# Patient Record
Sex: Female | Born: 2000
Health system: Southern US, Community
[De-identification: ages and names within clinical notes are randomized; demographics above are authoritative.]

## PROBLEM LIST (undated history)

## (undated) HISTORY — PX: NO PAST SURGERIES: SHX2092

---
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
## 2021-02-01 DIAGNOSIS — Z20822 Contact with and (suspected) exposure to covid-19: Secondary | ICD-10-CM | POA: Diagnosis not present

## 2021-04-03 DIAGNOSIS — Z23 Encounter for immunization: Secondary | ICD-10-CM | POA: Diagnosis not present

## 2021-04-03 DIAGNOSIS — Z0000 Encounter for general adult medical examination without abnormal findings: Secondary | ICD-10-CM | POA: Diagnosis not present

## 2021-04-03 DIAGNOSIS — Z713 Dietary counseling and surveillance: Secondary | ICD-10-CM | POA: Diagnosis not present

## 2021-04-03 DIAGNOSIS — Z113 Encounter for screening for infections with a predominantly sexual mode of transmission: Secondary | ICD-10-CM | POA: Diagnosis not present

## 2021-04-03 DIAGNOSIS — M79601 Pain in right arm: Secondary | ICD-10-CM | POA: Diagnosis not present

## 2021-07-17 DIAGNOSIS — H5213 Myopia, bilateral: Secondary | ICD-10-CM | POA: Diagnosis not present

## 2021-07-21 IMAGING — CR DG FOOT COMPLETE 3+V*L*
3 series · 3 of 3 positions shown · non-contrast
Comparison: None.

CLINICAL DATA: 19-year-old female with left foot pain and swelling.

EXAM:
LEFT FOOT - COMPLETE 3+ VIEW

[foot ap]
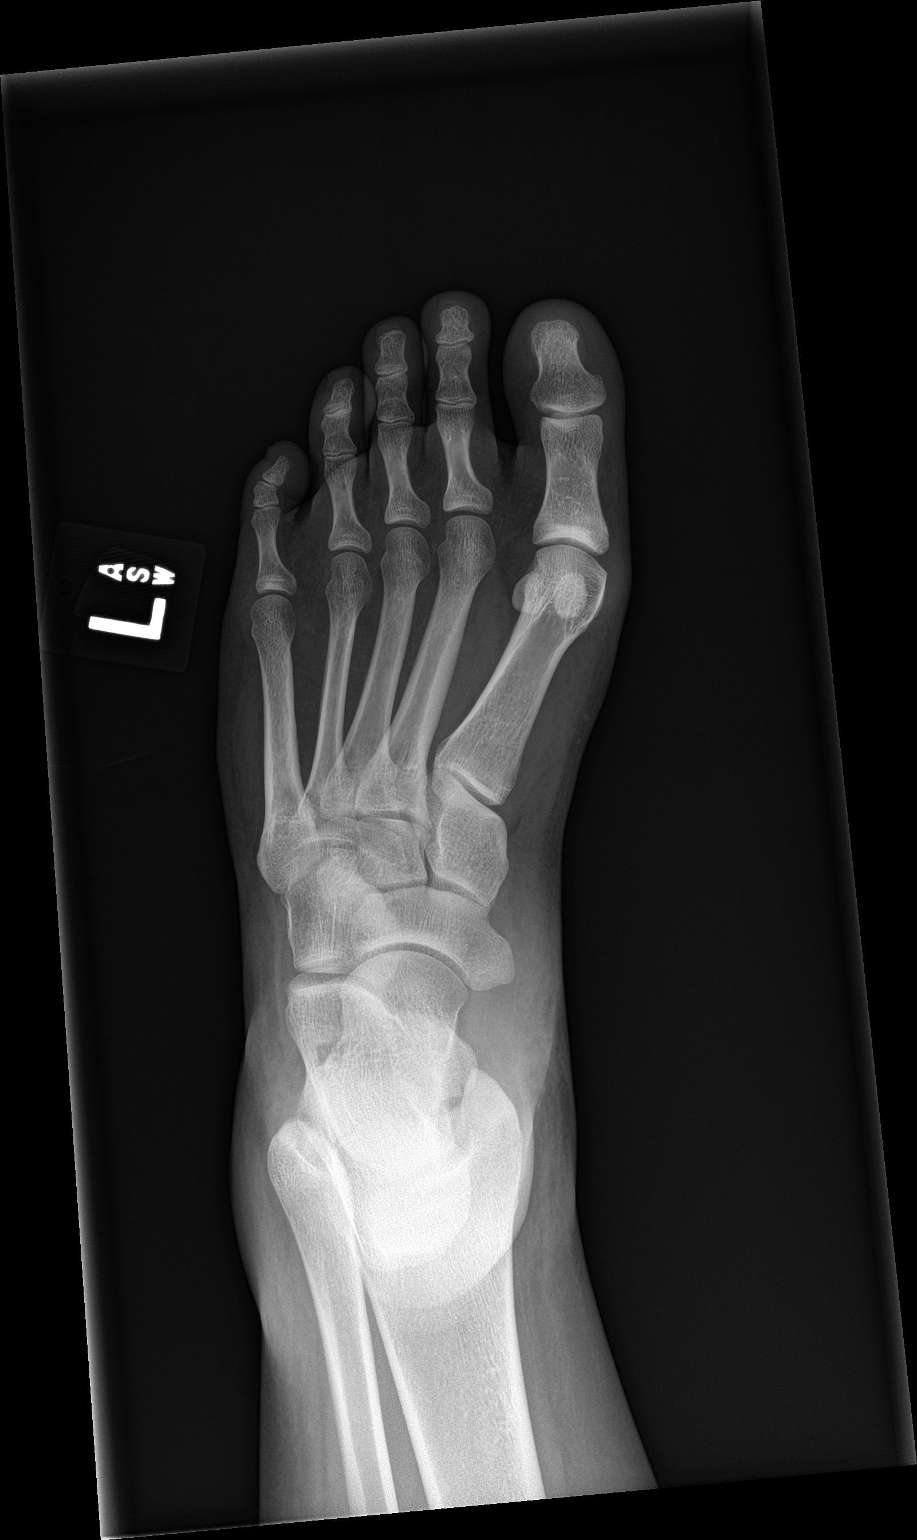

[foot obl]
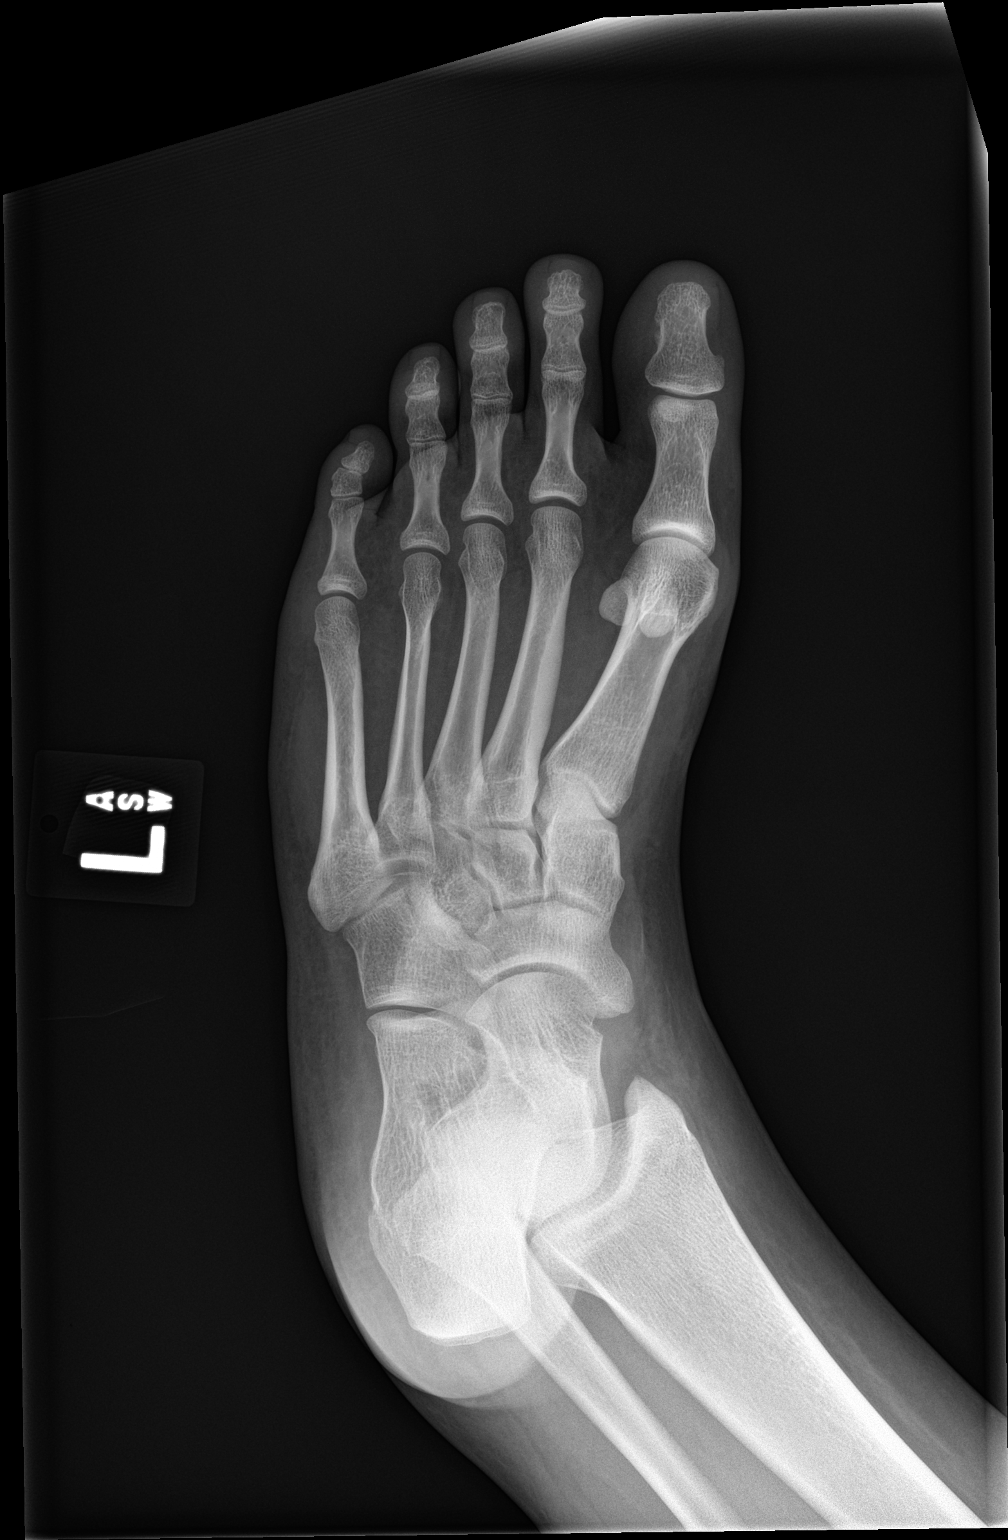

[foot lat]
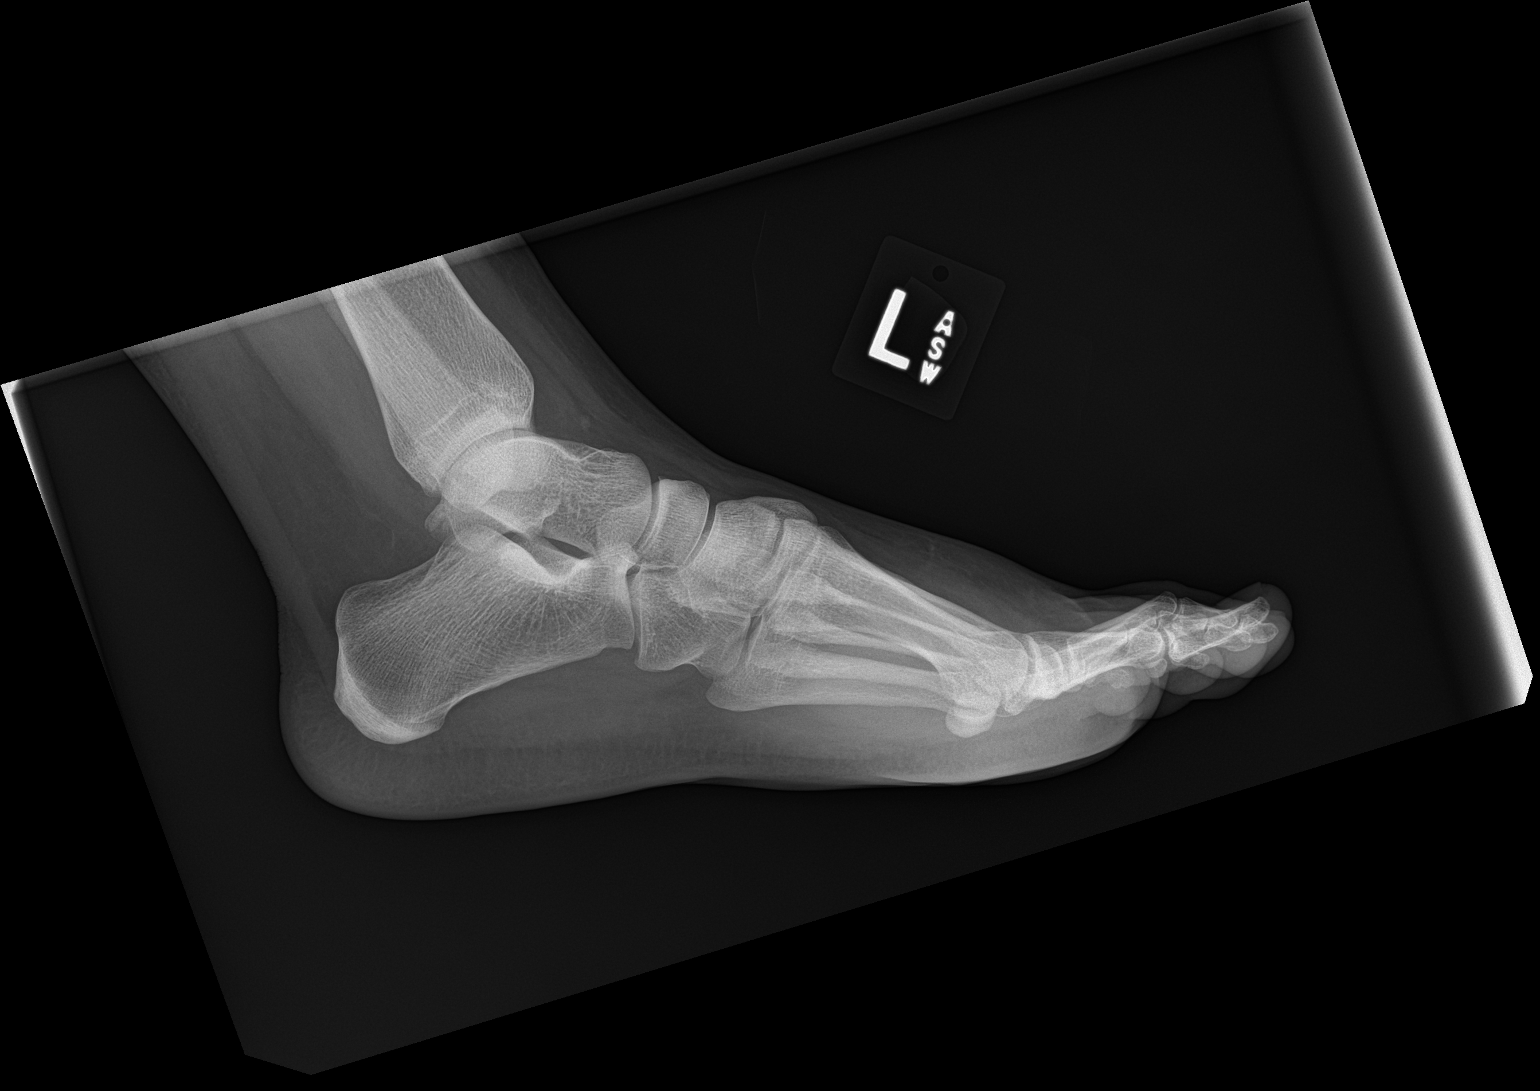

[3 of 3 positions shown; findings below may reference images not displayed]

FINDINGS: There is no evidence of fracture or dislocation. There is no
evidence of arthropathy or other focal bone abnormality. Soft
tissues are unremarkable.
IMPRESSION: Negative.

## 2021-08-08 DIAGNOSIS — M79601 Pain in right arm: Secondary | ICD-10-CM | POA: Diagnosis not present
